# Patient Record
Sex: Female | Born: 1945 | Race: White | Hispanic: No | Marital: Single | Smoking: Current every day smoker
Health system: Southern US, Community
[De-identification: ages and names within clinical notes are randomized; demographics above are authoritative.]

## PROBLEM LIST (undated history)

## (undated) DIAGNOSIS — M549 Dorsalgia, unspecified: Secondary | ICD-10-CM

## (undated) DIAGNOSIS — E7800 Pure hypercholesterolemia, unspecified: Secondary | ICD-10-CM

## (undated) DIAGNOSIS — I10 Essential (primary) hypertension: Secondary | ICD-10-CM

## (undated) HISTORY — PX: CATARACT EXTRACTION: SUR2

## (undated) HISTORY — PX: APPENDECTOMY: SHX54

---

## 2011-02-18 ENCOUNTER — Emergency Department (INDEPENDENT_AMBULATORY_CARE_PROVIDER_SITE_OTHER): Payer: No Typology Code available for payment source

## 2011-02-18 ENCOUNTER — Emergency Department (HOSPITAL_BASED_OUTPATIENT_CLINIC_OR_DEPARTMENT_OTHER)
Admission: EM | Admit: 2011-02-18 | Discharge: 2011-02-18 | Disposition: A | Discharge status: Home / Self Care | Payer: No Typology Code available for payment source | Attending: Emergency Medicine | Admitting: Emergency Medicine

## 2011-02-18 ENCOUNTER — Encounter: Payer: Self-pay | Admitting: Family Medicine

## 2011-02-18 DIAGNOSIS — M25519 Pain in unspecified shoulder: Secondary | ICD-10-CM

## 2011-02-18 DIAGNOSIS — E7800 Pure hypercholesterolemia, unspecified: Secondary | ICD-10-CM | POA: Insufficient documentation

## 2011-02-18 DIAGNOSIS — M25529 Pain in unspecified elbow: Secondary | ICD-10-CM | POA: Insufficient documentation

## 2011-02-18 DIAGNOSIS — M25569 Pain in unspecified knee: Secondary | ICD-10-CM

## 2011-02-18 DIAGNOSIS — I10 Essential (primary) hypertension: Secondary | ICD-10-CM | POA: Insufficient documentation

## 2011-02-18 DIAGNOSIS — T07XXXA Unspecified multiple injuries, initial encounter: Secondary | ICD-10-CM

## 2011-02-18 DIAGNOSIS — E119 Type 2 diabetes mellitus without complications: Secondary | ICD-10-CM | POA: Insufficient documentation

## 2011-02-18 HISTORY — DX: Essential (primary) hypertension: I10

## 2011-02-18 HISTORY — DX: Pure hypercholesterolemia, unspecified: E78.00

## 2011-02-18 HISTORY — DX: Dorsalgia, unspecified: M54.9

## 2011-02-18 MED ORDER — HYDROCODONE-ACETAMINOPHEN 5-325 MG PO TABS: 1.0000 | ORAL_TABLET | ORAL | Status: AC | PRN

## 2011-02-18 MED ORDER — ACETAMINOPHEN 325 MG PO TABS
650.0000 mg | ORAL_TABLET | Freq: Once | ORAL | Status: AC
  Administered 2011-02-18: 650 mg via ORAL
  Filled 2011-02-18: qty 2

## 2011-02-18 NOTE — ED Notes (Signed)
Pt. Reports seeing a Ciiropracter for her back for chronic pain
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
## 2017-02-18 ENCOUNTER — Telehealth (HOSPITAL_COMMUNITY): Payer: Self-pay | Admitting: Vascular Surgery

## 2017-02-18 NOTE — Telephone Encounter (Signed)
Left pt message to make NEW BRST appt w/ ECHO

## 2017-02-20 ENCOUNTER — Telehealth (HOSPITAL_COMMUNITY): Payer: Self-pay | Admitting: Vascular Surgery

## 2017-02-20 NOTE — Telephone Encounter (Signed)
Left pt message to make new pt brst w/ echo

## 2017-02-21 ENCOUNTER — Encounter (HOSPITAL_COMMUNITY): Payer: Self-pay | Admitting: Vascular Surgery

## 2017-02-21 ENCOUNTER — Telehealth (HOSPITAL_COMMUNITY): Payer: Self-pay | Admitting: Vascular Surgery

## 2017-02-21 NOTE — Telephone Encounter (Signed)
Sent pt letter to contact office to make new pt brst appt w/ echo w/ db per Dr. Welton Flakes

## 2020-02-18 DEATH — deceased
